# Patient Record
Sex: Male | Born: 1965 | Race: White | Hispanic: No | Marital: Married | Smoking: Never smoker
Health system: Southern US, Community
[De-identification: ages and names within clinical notes are randomized; demographics above are authoritative.]

## PROBLEM LIST (undated history)

## (undated) DIAGNOSIS — I499 Cardiac arrhythmia, unspecified: Secondary | ICD-10-CM

## (undated) DIAGNOSIS — N189 Chronic kidney disease, unspecified: Secondary | ICD-10-CM

## (undated) DIAGNOSIS — G4730 Sleep apnea, unspecified: Secondary | ICD-10-CM

## (undated) DIAGNOSIS — C801 Malignant (primary) neoplasm, unspecified: Secondary | ICD-10-CM

## (undated) DIAGNOSIS — M109 Gout, unspecified: Secondary | ICD-10-CM

## (undated) HISTORY — PX: OTHER SURGICAL HISTORY: SHX169

## (undated) HISTORY — PX: CARDIAC ELECTROPHYSIOLOGY MAPPING AND ABLATION: SHX1292

---

## 2009-05-30 ENCOUNTER — Emergency Department: Payer: Self-pay | Admitting: Emergency Medicine

## 2012-09-11 ENCOUNTER — Ambulatory Visit: Payer: Self-pay | Admitting: Orthopedic Surgery

## 2014-10-28 ENCOUNTER — Encounter: Payer: Self-pay | Admitting: *Deleted

## 2014-10-31 ENCOUNTER — Ambulatory Visit: Payer: Managed Care, Other (non HMO) | Admitting: Anesthesiology

## 2014-10-31 ENCOUNTER — Encounter: Payer: Self-pay | Admitting: Anesthesiology

## 2014-10-31 ENCOUNTER — Ambulatory Visit
Admission: RE | Admit: 2014-10-31 | Discharge: 2014-10-31 | Disposition: A | Discharge status: Home / Self Care | Payer: Managed Care, Other (non HMO) | Source: Ambulatory Visit | Attending: Gastroenterology | Admitting: Gastroenterology

## 2014-10-31 ENCOUNTER — Encounter
Admission: RE | Disposition: A | Discharge status: Home / Self Care | Payer: Self-pay | Source: Ambulatory Visit | Attending: Gastroenterology

## 2014-10-31 DIAGNOSIS — Z1211 Encounter for screening for malignant neoplasm of colon: Secondary | ICD-10-CM | POA: Diagnosis present

## 2014-10-31 DIAGNOSIS — G4730 Sleep apnea, unspecified: Secondary | ICD-10-CM | POA: Insufficient documentation

## 2014-10-31 DIAGNOSIS — Z85820 Personal history of malignant melanoma of skin: Secondary | ICD-10-CM | POA: Diagnosis not present

## 2014-10-31 DIAGNOSIS — N189 Chronic kidney disease, unspecified: Secondary | ICD-10-CM | POA: Diagnosis not present

## 2014-10-31 DIAGNOSIS — Z79899 Other long term (current) drug therapy: Secondary | ICD-10-CM | POA: Insufficient documentation

## 2014-10-31 DIAGNOSIS — D122 Benign neoplasm of ascending colon: Secondary | ICD-10-CM | POA: Insufficient documentation

## 2014-10-31 DIAGNOSIS — K621 Rectal polyp: Secondary | ICD-10-CM | POA: Insufficient documentation

## 2014-10-31 DIAGNOSIS — I499 Cardiac arrhythmia, unspecified: Secondary | ICD-10-CM | POA: Insufficient documentation

## 2014-10-31 DIAGNOSIS — D120 Benign neoplasm of cecum: Secondary | ICD-10-CM | POA: Insufficient documentation

## 2014-10-31 DIAGNOSIS — M109 Gout, unspecified: Secondary | ICD-10-CM | POA: Insufficient documentation

## 2014-10-31 HISTORY — DX: Cardiac arrhythmia, unspecified: I49.9

## 2014-10-31 HISTORY — DX: Sleep apnea, unspecified: G47.30

## 2014-10-31 HISTORY — DX: Gout, unspecified: M10.9

## 2014-10-31 HISTORY — PX: COLONOSCOPY WITH PROPOFOL: SHX5780

## 2014-10-31 HISTORY — DX: Chronic kidney disease, unspecified: N18.9

## 2014-10-31 SURGERY — COLONOSCOPY WITH PROPOFOL
Anesthesia: General

## 2014-10-31 MED ORDER — LIDOCAINE HCL (CARDIAC) 20 MG/ML IV SOLN
INTRAVENOUS | Status: DC | PRN
  Administered 2014-10-31: 100 mg via INTRAVENOUS

## 2014-10-31 MED ORDER — SODIUM CHLORIDE 0.9 % IV SOLN: INTRAVENOUS | Status: DC

## 2014-10-31 MED ORDER — PROPOFOL INFUSION 10 MG/ML OPTIME
INTRAVENOUS | Status: DC | PRN
  Administered 2014-10-31: 150 ug/kg/min via INTRAVENOUS

## 2014-10-31 MED ORDER — SODIUM CHLORIDE 0.9 % IV SOLN
INTRAVENOUS | Status: DC
  Administered 2014-10-31: 1000 mL via INTRAVENOUS

## 2014-10-31 MED ORDER — LIDOCAINE HCL (PF) 1 % IJ SOLN
2.0000 mL | Freq: Once | INTRAMUSCULAR | Status: AC
  Administered 2014-10-31: 0.3 mL via INTRADERMAL

## 2014-10-31 MED ORDER — LIDOCAINE HCL (PF) 1 % IJ SOLN
INTRAMUSCULAR | Status: AC
  Administered 2014-10-31: 0.3 mL via INTRADERMAL
  Filled 2014-10-31: qty 2

## 2014-10-31 MED ORDER — PROPOFOL 10 MG/ML IV BOLUS
INTRAVENOUS | Status: DC | PRN
  Administered 2014-10-31: 90 mg via INTRAVENOUS
  Administered 2014-10-31: 40 mg via INTRAVENOUS
  Administered 2014-10-31 (×2): 30 mg via INTRAVENOUS

## 2014-10-31 NOTE — H&P (Signed)
  Primary Care Physician:  Kirk Ruths., MD  Pre-Procedure History & Physical: HPI:  Evan Allen is a 49 y.o. male is here for an colonoscopy.   Past Medical History  Diagnosis Date  . Dysrhythmia   . Gout   . Sleep apnea   . Chronic kidney disease     Past Surgical History  Procedure Laterality Date  . Melanoma resection    . Cardiac electrophysiology mapping and ablation      Prior to Admission medications   Medication Sig Start Date End Date Taking? Authorizing Melodi Happel  colchicine-probenecid 0.5-500 MG per tablet Take 1 tablet by mouth 2 (two) times daily.   Yes Historical Neftali Thurow, MD    Allergies as of 10/04/2014  . (Not on File)    History reviewed. No pertinent family history.  Social History   Social History  . Marital Status: Married    Spouse Name: N/A  . Number of Children: N/A  . Years of Education: N/A   Occupational History  . Not on file.   Social History Main Topics  . Smoking status: Not on file  . Smokeless tobacco: Not on file  . Alcohol Use: Not on file  . Drug Use: Not on file  . Sexual Activity: Not on file   Other Topics Concern  . Not on file   Social History Narrative     Physical Exam: BP 129/78 mmHg  Temp(Src) 97.9 F (36.6 C) (Tympanic)  Resp 17  Ht 6' (1.829 m)  Wt 88.451 kg (195 lb)  BMI 26.44 kg/m2  SpO2 98% General:   Alert,  pleasant and cooperative in NAD Head:  Normocephalic and atraumatic. Neck:  Supple; no masses or thyromegaly. Lungs:  Clear throughout to auscultation.    Heart:  Regular rate and rhythm. Abdomen:  Soft, nontender and nondistended. Normal bowel sounds, without guarding, and without rebound.   Neurologic:  Alert and  oriented x4;  grossly normal neurologically.  Impression/Plan: Evan Allen is here for an colonoscopy to be performed for screening avg risk  Risks, benefits, limitations, and alternatives regarding  colonoscopy have been reviewed with the patient.  Questions  have been answered.  All parties agreeable.   Josefine Class, MD  10/31/2014, 9:24 AM

## 2014-10-31 NOTE — Anesthesia Postprocedure Evaluation (Signed)
  Anesthesia Post-op Note  Patient: Evan Allen  Procedure(s) Performed: Procedure(s): COLONOSCOPY WITH PROPOFOL (N/A)  Anesthesia type:General  Patient location: PACU  Post pain: Pain level controlled  Post assessment: Post-op Vital signs reviewed, Patient's Cardiovascular Status Stable, Respiratory Function Stable, Patent Airway and No signs of Nausea or vomiting  Post vital signs: Reviewed and stable  Last Vitals:  Filed Vitals:   10/31/14 1030  BP: 111/68  Pulse: 68  Temp:   Resp: 10    Level of consciousness: awake, alert  and patient cooperative  Complications: No apparent anesthesia complications

## 2014-10-31 NOTE — Transfer of Care (Signed)
Immediate Anesthesia Transfer of Care Note  Patient: Evan Allen  Procedure(s) Performed: Procedure(s): COLONOSCOPY WITH PROPOFOL (N/A)  Patient Location: Endoscopy Unit  Anesthesia Type:General  Level of Consciousness: awake  Airway & Oxygen Therapy: Patient Spontanous Breathing and Patient connected to nasal cannula oxygen  Post-op Assessment: Report given to RN and Post -op Vital signs reviewed and stable  Post vital signs: Reviewed and stable  Last Vitals:  Filed Vitals:   10/31/14 1003  BP: 106/76  Pulse: 67  Temp:   Resp: 15    Complications: No apparent anesthesia complications

## 2014-10-31 NOTE — Op Note (Signed)
[facility] Patient Name: Evan Allen Procedure Date: 10/31/2014 9:25 AM MRN: [id number] Account #: [id number] Date of Birth: 1965-02-24 Admit Type: Outpatient Age: 49 Room: [facility] ENDO ROOM 3 Gender: Male Note Status: Finalized Procedure:         Colonoscopy Indications:       Screening for colorectal malignant neoplasm, This is the                     patient's first colonoscopy Patient Profile:   This is a 49 year old male. Providers:         Gerrit Heck. Rayann Heman, MD Referring MD:      Ocie Cornfield. Ouida Sills, MD (Referring MD) Medicines:         Propofol per Anesthesia Complications:     No immediate complications. Procedure:         Pre-Anesthesia Assessment:                    - Prior to the procedure, a History and Physical was                     performed, and patient medications, allergies and                     sensitivities were reviewed. The patient's tolerance of                     previous anesthesia was reviewed.                    - Prior to the procedure, a History and Physical was                     performed, and patient medications, allergies and                     sensitivities were reviewed. The patient's tolerance of                     previous anesthesia was reviewed.                    After obtaining informed consent, the colonoscope was                     passed under direct vision. Throughout the procedure, the                     patient's blood pressure, pulse, and oxygen saturations                     were monitored continuously. The Colonoscope was                     introduced through the anus and advanced to the the cecum,                     identified by appendiceal orifice and ileocecal valve. The                     colonoscopy was performed without difficulty. The patient                     tolerated the procedure well. The quality of the bowel  preparation was excellent. Findings:    The perianal and digital rectal examinations were normal.      Two sessile polyps were found in the cecum. The polyps were 2 to 3 mm in       size. These polyps were removed with a jumbo cold forceps. Resection and       retrieval were complete.      Two sessile polyps were found in the ascending colon. The polyps were 2       to 3 mm in size. These polyps were removed with a jumbo cold forceps.       Resection and retrieval were complete.      A 3 mm polyp was found in the rectum. The polyp was sessile. The polyp       was removed with a jumbo cold forceps. Resection and retrieval were       complete.      The exam was otherwise without abnormality on direct and retroflexion       views. Impression:        - Two 2 to 3 mm polyps in the cecum. Resected and                     retrieved.                    - Two 2 to 3 mm polyps in the ascending colon. Resected                     and retrieved.                    - One 3 mm polyp in the rectum. Resected and retrieved.                    - The examination was otherwise normal on direct and                     retroflexion views. Recommendation:    - Observe patient in GI recovery unit.                    - Resume regular diet.                    - Continue present medications.                    - Await pathology results.                    - Repeat colonoscopy for surveillance based on pathology                     results.                    - The findings and recommendations were discussed with the                     patient.                    - The findings and recommendations were discussed with the                     patient's family. Procedure Code(s): --- Professional ---  02111, Colonoscopy, flexible; with biopsy, single or                     multiple CPT copyright 2014 American Medical Association. All rights reserved. The codes documented in this report are preliminary and upon coder review may  be  revised to meet current compliance requirements. Mellody Life, MD 10/31/2014 10:04:30 AM This report has been signed electronically. Number of Addenda: 0 Note Initiated On: 10/31/2014 9:25 AM Scope Withdrawal Time: 0 hours 17 minutes 57 seconds  Total Procedure Duration: 0 hours 24 minutes 20 seconds       [facility]

## 2014-10-31 NOTE — Anesthesia Preprocedure Evaluation (Signed)
Anesthesia Evaluation  Patient identified by MRN, date of birth, ID band Patient awake    Reviewed: Allergy & Precautions, H&P , NPO status , Patient's Chart, lab work & pertinent test results  Airway Mallampati: II  TM Distance: >3 FB Neck ROM: full    Dental no notable dental hx. (+) Teeth Intact   Pulmonary sleep apnea ,    Pulmonary exam normal breath sounds clear to auscultation       Cardiovascular Exercise Tolerance: Good (-) Past MI Normal cardiovascular exam+ dysrhythmias Atrial Fibrillation  Rhythm:regular Rate:Normal     Neuro/Psych negative neurological ROS  negative psych ROS   GI/Hepatic negative GI ROS, Neg liver ROS, neg GERD  ,  Endo/Other  negative endocrine ROS  Renal/GU Renal disease  negative genitourinary   Musculoskeletal   Abdominal   Peds  Hematology negative hematology ROS (+)   Anesthesia Other Findings Past Medical History:   Dysrhythmia                                                  Gout                                                         Sleep apnea                                                  Chronic kidney disease                                       Reproductive/Obstetrics negative OB ROS                             Anesthesia Physical Anesthesia Plan  ASA: III  Anesthesia Plan: General   Post-op Pain Management:    Induction:   Airway Management Planned:   Additional Equipment:   Intra-op Plan:   Post-operative Plan:   Informed Consent: I have reviewed the patients History and Physical, chart, labs and discussed the procedure including the risks, benefits and alternatives for the proposed anesthesia with the patient or authorized representative who has indicated his/her understanding and acceptance.   Dental Advisory Given  Plan Discussed with: Anesthesiologist, CRNA and Surgeon  Anesthesia Plan Comments:          Anesthesia Quick Evaluation

## 2014-11-01 LAB — SURGICAL PATHOLOGY

## 2014-11-02 ENCOUNTER — Encounter: Payer: Self-pay | Admitting: Gastroenterology

## 2016-08-06 ENCOUNTER — Ambulatory Visit (INDEPENDENT_AMBULATORY_CARE_PROVIDER_SITE_OTHER): Payer: 59 | Admitting: Psychology

## 2016-08-06 DIAGNOSIS — F341 Dysthymic disorder: Secondary | ICD-10-CM

## 2016-08-22 ENCOUNTER — Ambulatory Visit (INDEPENDENT_AMBULATORY_CARE_PROVIDER_SITE_OTHER): Payer: 59 | Admitting: Psychology

## 2016-08-22 DIAGNOSIS — F341 Dysthymic disorder: Secondary | ICD-10-CM

## 2016-09-05 ENCOUNTER — Ambulatory Visit: Payer: 59 | Admitting: Psychology

## 2016-09-09 ENCOUNTER — Encounter: Payer: Self-pay | Admitting: Emergency Medicine

## 2016-09-09 ENCOUNTER — Emergency Department
Admission: EM | Admit: 2016-09-09 | Discharge: 2016-09-09 | Disposition: A | Discharge status: Home / Self Care | Payer: 59 | Attending: Emergency Medicine | Admitting: Emergency Medicine

## 2016-09-09 DIAGNOSIS — N189 Chronic kidney disease, unspecified: Secondary | ICD-10-CM | POA: Insufficient documentation

## 2016-09-09 DIAGNOSIS — Z79899 Other long term (current) drug therapy: Secondary | ICD-10-CM | POA: Diagnosis not present

## 2016-09-09 DIAGNOSIS — W2104XA Struck by golf ball, initial encounter: Secondary | ICD-10-CM | POA: Diagnosis not present

## 2016-09-09 DIAGNOSIS — Y998 Other external cause status: Secondary | ICD-10-CM | POA: Diagnosis not present

## 2016-09-09 DIAGNOSIS — S01112A Laceration without foreign body of left eyelid and periocular area, initial encounter: Secondary | ICD-10-CM | POA: Diagnosis present

## 2016-09-09 DIAGNOSIS — Y9239 Other specified sports and athletic area as the place of occurrence of the external cause: Secondary | ICD-10-CM | POA: Insufficient documentation

## 2016-09-09 DIAGNOSIS — Y9353 Activity, golf: Secondary | ICD-10-CM | POA: Diagnosis not present

## 2016-09-09 MED ORDER — CEPHALEXIN 750 MG PO CAPS: 750.0000 mg | ORAL_CAPSULE | Freq: Two times a day (BID) | ORAL | 0 refills | Status: AC

## 2016-09-09 MED ORDER — LIDOCAINE HCL (PF) 1 % IJ SOLN
10.0000 mL | Freq: Once | INTRAMUSCULAR | Status: DC
  Filled 2016-09-09: qty 10

## 2016-09-09 NOTE — ED Provider Notes (Signed)
[facility] Emergency Department Provider Note  ____________________________________________  Time seen: Approximately 9:22 PM  I have reviewed the triage vital signs and the nursing notes.   HISTORY  Chief Complaint Head Laceration    HPI Evan Allen is a 51 y.o. male who presents emergency department complaining of a laceration to his left eyebrow. Patient reports that he was playing golf when he struck a shot which pounds off a tree and caught him in the left eyebrow. Patient denies any loss of consciousness. He denies any blurred vision or eye pain. Only injury is laceration left eyebrow. Tetanus shot is less than 110 years old. No medications prior to arrival. No other injury or complaint at this time.   Past Medical History:  Diagnosis Date  . Chronic kidney disease   . Dysrhythmia   . Gout   . Sleep apnea     There are no active problems to display for this patient.   Past Surgical History:  Procedure Laterality Date  . CARDIAC ELECTROPHYSIOLOGY MAPPING AND ABLATION    . COLONOSCOPY WITH PROPOFOL N/A 10/31/2014   Procedure: COLONOSCOPY WITH PROPOFOL;  Surgeon: Josefine Class, MD;  Location: [facility] ENDOSCOPY;  Service: Endoscopy;  Laterality: N/A;  . melanoma resection      Prior to Admission medications   Medication Sig Start Date End Date Taking? Authorizing Provider  cephALEXin (KEFLEX) 750 MG capsule Take 1 capsule (750 mg total) by mouth 2 (two) times daily. 09/09/16   Cuthriell, Charline Bills, PA-C  colchicine-probenecid 0.5-500 MG per tablet Take 1 tablet by mouth 2 (two) times daily.    [provider]    Allergies Patient has no known allergies.  No family history on file.  Social History Social History  Substance Use Topics  . Smoking status: Never Smoker  . Smokeless tobacco: Never Used  . Alcohol use Not on file     Review of Systems  Constitutional: No fever/chills Eyes: No visual changes. No  discharge ENT: No upper respiratory complaints. Cardiovascular: no chest pain. Respiratory: no cough. No SOB. Gastrointestinal: No abdominal pain.  No nausea, no vomiting.   Musculoskeletal: Negative for musculoskeletal pain. Skin: Positive for laceration to the left eyebrow Neurological: Negative for headaches, focal weakness or numbness. 10-point ROS otherwise negative.  ____________________________________________   PHYSICAL EXAM:  VITAL SIGNS: ED Triage Vitals  Enc Vitals Group     BP 09/09/16 2115 132/81     Pulse --      Resp 09/09/16 2115 16     Temp 09/09/16 2115 98 F (36.7 C)     Temp Source 09/09/16 2115 Oral     SpO2 09/09/16 2115 96 %     Weight 09/09/16 2116 198 lb (89.8 kg)     Height 09/09/16 2116 6\' 1"  (1.854 m)     Head Circumference --      Peak Flow --      Pain Score 09/09/16 2115 2     Pain Loc --      Pain Edu? --      Excl. in [place]? --      Constitutional: Alert and oriented. Well appearing and in no acute distress. Eyes: Conjunctivae are normal. PERRL. EOMI. Head: 2.5 cm laceration noted to left eyebrow. Edges are smooth nature. Edges are gaped open. No bleeding. No foreign body. No surrounding ecchymosis. Patient is nontender to palpation of the osseous structures of the skull and face. No battle signs. No raccoon eyes. No serosanguineous  fluid drainage from ears or nares. ENT:      Ears:       Nose: No congestion/rhinnorhea.      Mouth/Throat: Mucous membranes are moist.  Neck: No stridor.  No cervical spine tenderness to palpation  Cardiovascular: Normal rate, regular rhythm. Normal S1 and S2.  Good peripheral circulation. Respiratory: Normal respiratory effort without tachypnea or retractions. Lungs CTAB. Good air entry to the bases with no decreased or absent breath sounds. Musculoskeletal: Full range of motion to all extremities. No gross deformities appreciated. Neurologic:  Normal speech and language. No gross focal neurologic deficits are  appreciated.  Skin:  Skin is warm, dry and intact. No rash noted. See note above for laceration. Psychiatric: Mood and affect are normal. Speech and behavior are normal. Patient exhibits appropriate insight and judgement.   ____________________________________________   LABS (all labs ordered are listed, but only abnormal results are displayed)  Labs Reviewed - No data to display ____________________________________________  EKG   ____________________________________________  RADIOLOGY   No results found.  ____________________________________________    PROCEDURES  Procedure(s) performed:    Marland KitchenMarland KitchenLaceration Repair Date/Time: 09/09/2016 10:24 PM Performed by: Betha Loa D Authorized by: Betha Loa D   Consent:    Consent obtained:  Verbal   Consent given by:  Patient   Risks discussed:  Pain and poor cosmetic result Anesthesia (see MAR for exact dosages):    Anesthesia method:  Local infiltration   Local anesthetic:  Lidocaine 1% w/o epi Laceration details:    Location:  Face   Face location:  L eyebrow   Length (cm):  2.5 Repair type:    Repair type:  Intermediate Pre-procedure details:    Preparation:  Patient was prepped and draped in usual sterile fashion Exploration:    Hemostasis achieved with:  Direct pressure   Wound exploration: wound explored through full range of motion and entire depth of wound probed and visualized     Wound extent: no foreign bodies/material noted, no muscle damage noted, no nerve damage noted, no tendon damage noted and no underlying fracture noted     Contaminated: yes   Treatment:    Area cleansed with:  Betadine   Amount of cleaning:  Extensive   Irrigation solution:  Sterile saline   Irrigation method:  Syringe Subcutaneous repair:    Suture size:  6-0   Suture material:  Vicryl   Suture technique:  Simple interrupted   Number of sutures:  4 Skin repair:    Repair method:  Sutures   Suture size:   5-0   Suture material:  Prolene   Suture technique:  Simple interrupted   Number of sutures:  7 Approximation:    Approximation:  Close Post-procedure details:    Dressing:  Open (no dressing)   Patient tolerance of procedure:  Tolerated well, no immediate complications      Medications  lidocaine (PF) (XYLOCAINE) 1 % injection 10 mL (not administered)     ____________________________________________   INITIAL IMPRESSION / ASSESSMENT AND PLAN / ED COURSE  Pertinent labs & imaging results that were available during my care of the patient were reviewed by me and considered in my medical decision making (see chart for details).  Review of the [place] CSRS was performed in accordance of the [place] prior to dispensing any controlled drugs.     Patient's diagnosis is consistent with eyebrow laceration. This is close described above. Patient tolerated well. Wound care instructions are given to patient. He will follow  up with primary care in 1 week for suture removal. Due to the nature of injury, patient will be placed on antibiotics prophylactically. Tetanus shot was up-to-date at this time..  Patient is given ED precautions to return to the ED for any worsening or new symptoms.     ____________________________________________  FINAL CLINICAL IMPRESSION(S) / ED DIAGNOSES  Final diagnoses:  Laceration of left eyebrow, initial encounter      NEW MEDICATIONS STARTED DURING THIS VISIT:  New Prescriptions   CEPHALEXIN (KEFLEX) 750 MG CAPSULE    Take 1 capsule (750 mg total) by mouth 2 (two) times daily.        This chart was dictated using voice recognition software/Dragon. Despite best efforts to proofread, errors can occur which can change the meaning. Any change was purely unintentional.    Darletta Moll, PA-C 09/09/16 2226    Lisa Roca, MD 09/09/16 [phone]

## 2016-09-09 NOTE — ED Triage Notes (Signed)
Pt arrived via pov after golfing accident. Pt reports trying to hit the ball around a tree instead the ball hit the tree and came back hitting him at the left inner portion of his eyebrow. Laceration's bleeding in contained.

## 2016-09-16 ENCOUNTER — Ambulatory Visit (INDEPENDENT_AMBULATORY_CARE_PROVIDER_SITE_OTHER): Payer: 59 | Admitting: Psychology

## 2016-09-16 DIAGNOSIS — F341 Dysthymic disorder: Secondary | ICD-10-CM | POA: Diagnosis not present

## 2016-10-28 ENCOUNTER — Ambulatory Visit: Payer: 59 | Admitting: Psychology

## 2016-11-11 ENCOUNTER — Ambulatory Visit: Payer: 59 | Admitting: Psychology

## 2016-12-05 ENCOUNTER — Ambulatory Visit: Payer: 59 | Admitting: Psychology

## 2016-12-18 ENCOUNTER — Ambulatory Visit: Payer: 59 | Admitting: Psychology

## 2019-01-12 ENCOUNTER — Other Ambulatory Visit: Payer: Self-pay | Admitting: Internal Medicine

## 2019-01-12 DIAGNOSIS — R0602 Shortness of breath: Secondary | ICD-10-CM

## 2019-01-12 DIAGNOSIS — R0789 Other chest pain: Secondary | ICD-10-CM

## 2019-01-13 ENCOUNTER — Ambulatory Visit
Admission: RE | Admit: 2019-01-13 | Discharge: 2019-01-13 | Disposition: A | Discharge status: Home / Self Care | Payer: 59 | Source: Ambulatory Visit | Attending: Internal Medicine | Admitting: Internal Medicine

## 2019-01-13 ENCOUNTER — Other Ambulatory Visit: Payer: Self-pay

## 2019-01-13 DIAGNOSIS — R0789 Other chest pain: Secondary | ICD-10-CM

## 2019-01-13 DIAGNOSIS — R0602 Shortness of breath: Secondary | ICD-10-CM

## 2019-01-13 HISTORY — DX: Malignant (primary) neoplasm, unspecified: C80.1

## 2019-01-13 MED ORDER — IOHEXOL 350 MG/ML SOLN
75.0000 mL | Freq: Once | INTRAVENOUS | Status: AC | PRN
  Administered 2019-01-13: 75 mL via INTRAVENOUS

## 2020-10-05 IMAGING — CT CT ANGIO CHEST
2 of 6 series · 18 of 46 positions shown · IV contrast (omnipaque)
Comparison: None.

CLINICAL DATA: Left-sided chest wall pain. Previous history of
cardiac ablation. Shortness of breath with exertion.

EXAM:
CT ANGIOGRAPHY CHEST WITH CONTRAST
TECHNIQUE: Multidetector CT imaging of the chest was performed using the
standard protocol during bolus administration of intravenous
contrast. Multiplanar CT image reconstructions and MIPs were
obtained to evaluate the vascular anatomy.
CONTRAST:  75mL OMNIPAQUE IOHEXOL 350 MG/ML SOLN

[Series 5: thins · axial · 0.80mm/px · z∈[-402,-120]mm · 15 of 310 slices shown]
[im 14/310  lung]
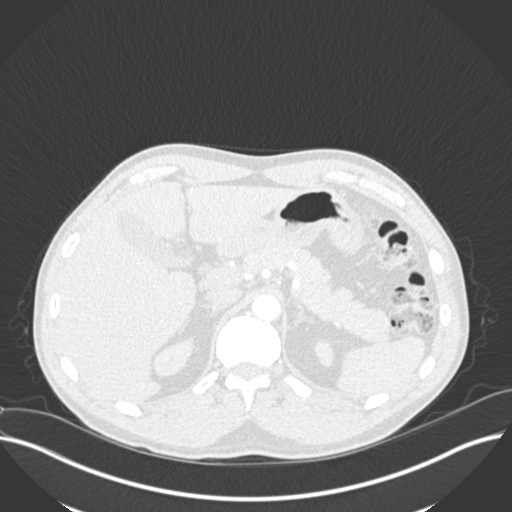
[im 41/310  soft-tissue]
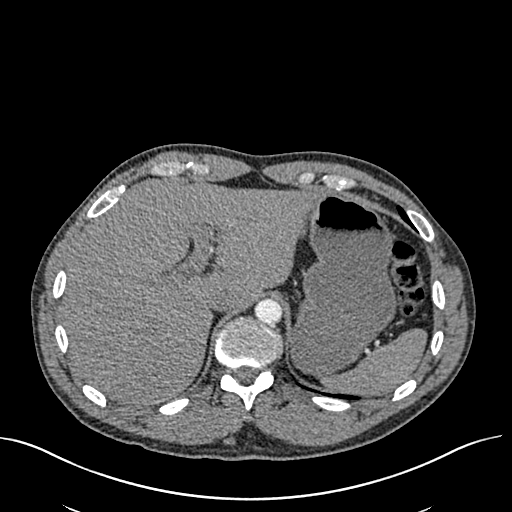
[im 54/310  lung]
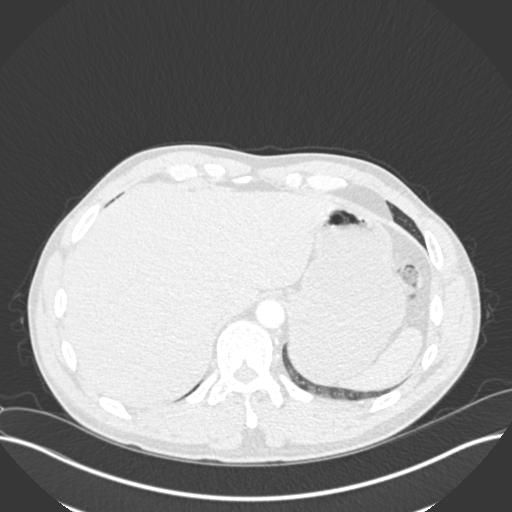
[im 81/310  soft-tissue]
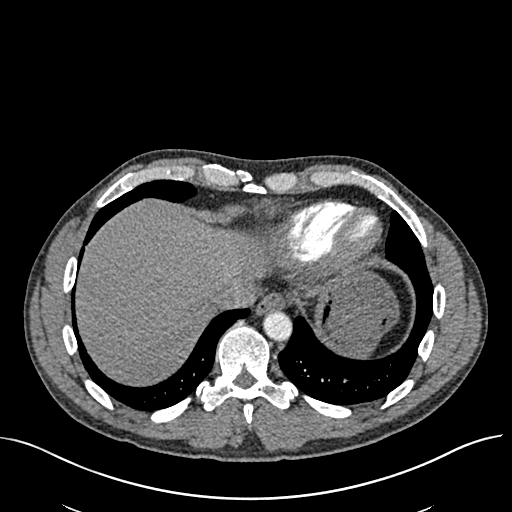
[im 95/310  lung]
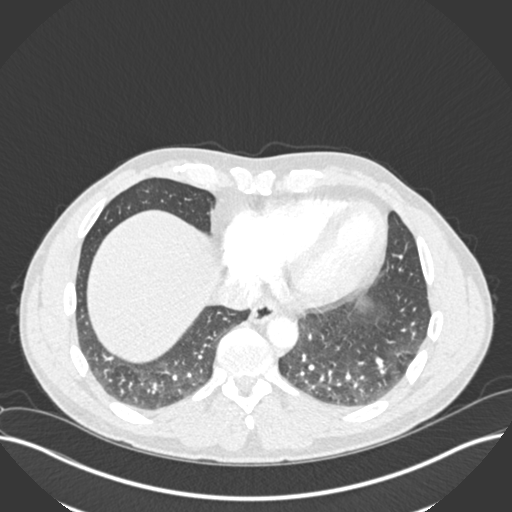
[im 121/310  soft-tissue]
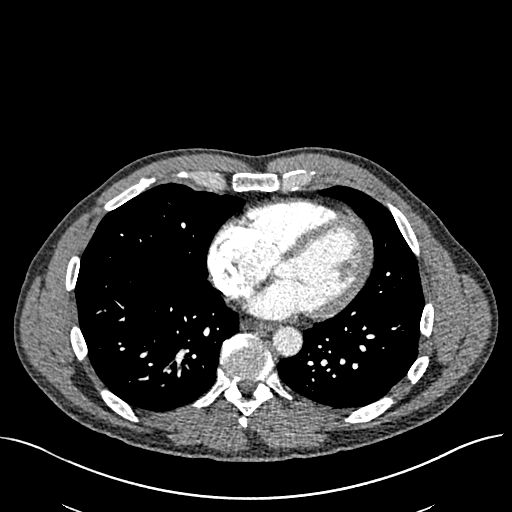
[im 135/310  lung]
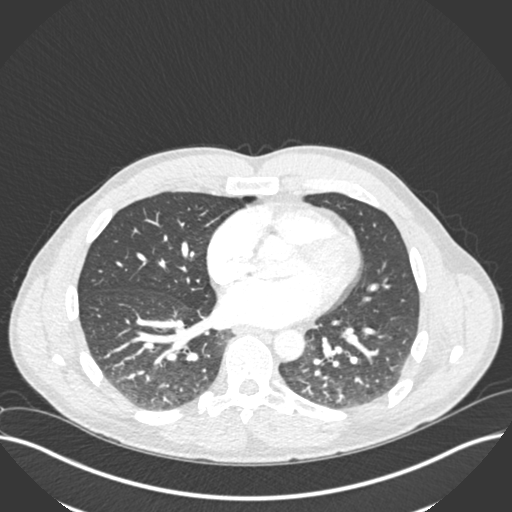
[im 162/310  soft-tissue]
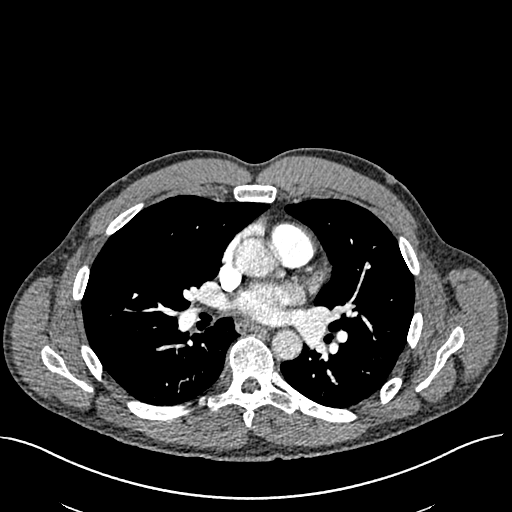
[im 175/310  lung]
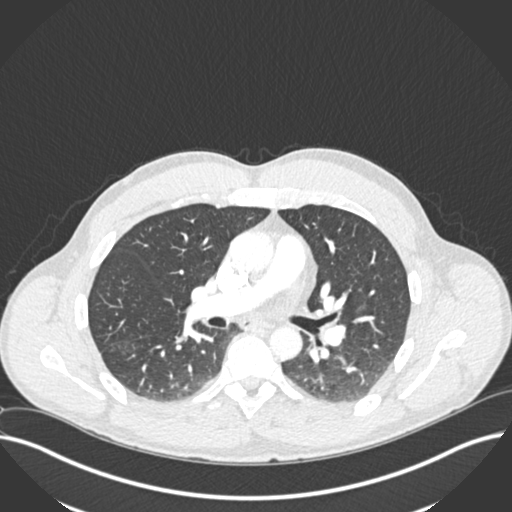
[im 189/310  soft-tissue]
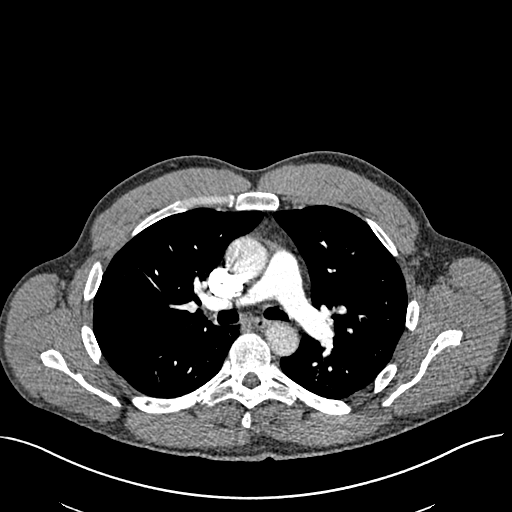
[im 215/310  lung]
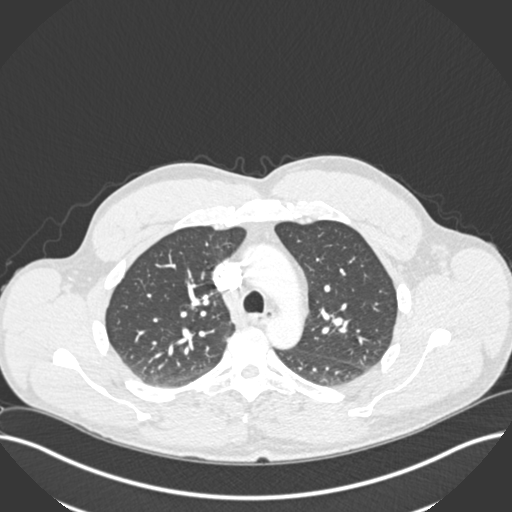
[im 229/310  soft-tissue]
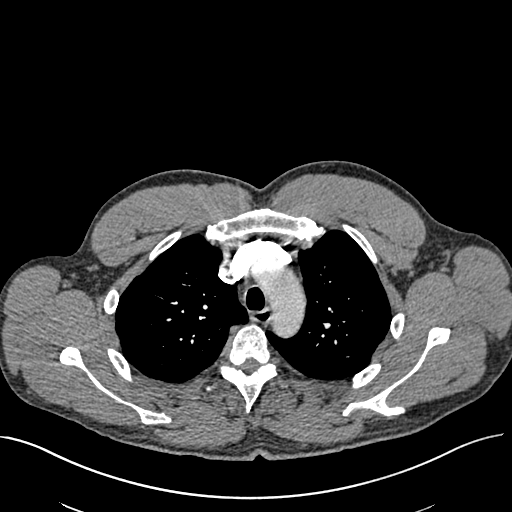
[im 256/310  lung]
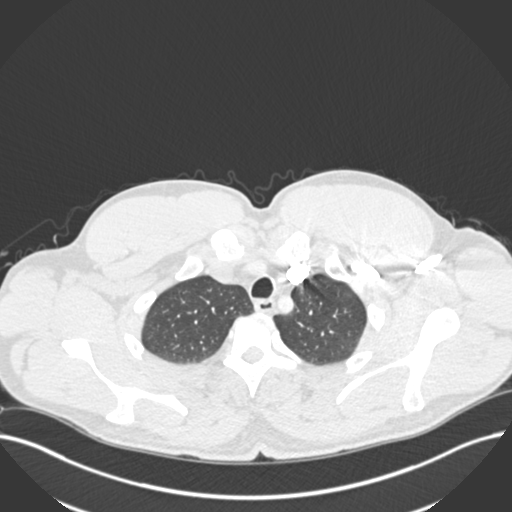
[im 269/310  soft-tissue]
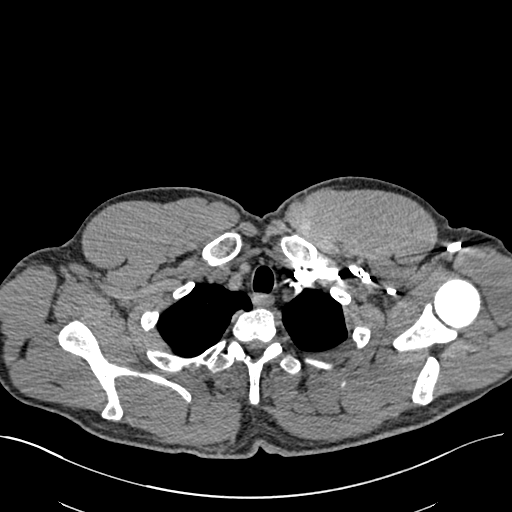
[im 296/310  lung]
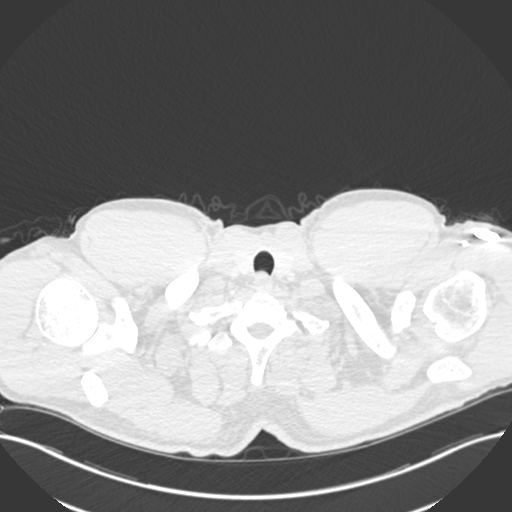

[Series 7: coronal mpr · coronal · 0.61mm/px · 3 of 90 slices shown]
[im 23/90  soft-tissue]
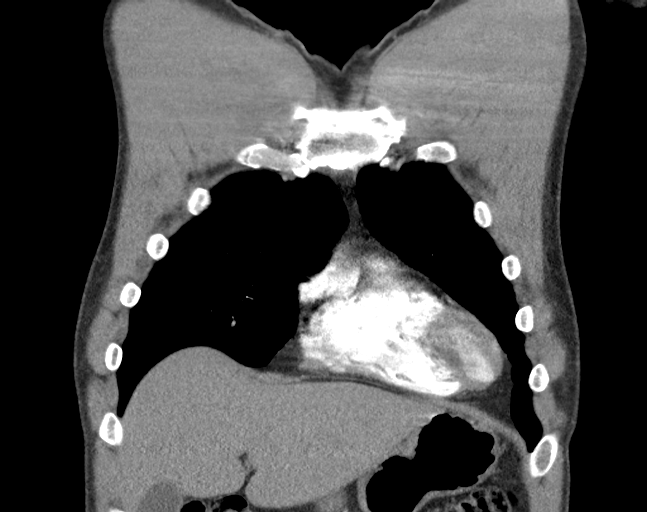
[im 45/90  soft-tissue]
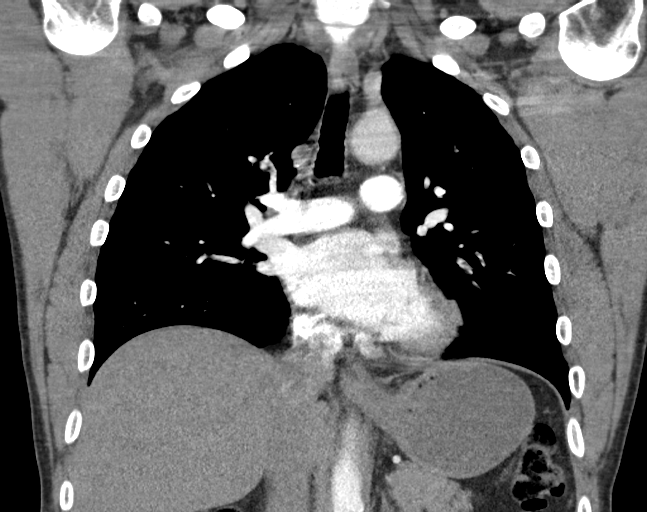
[im 67/90  soft-tissue]
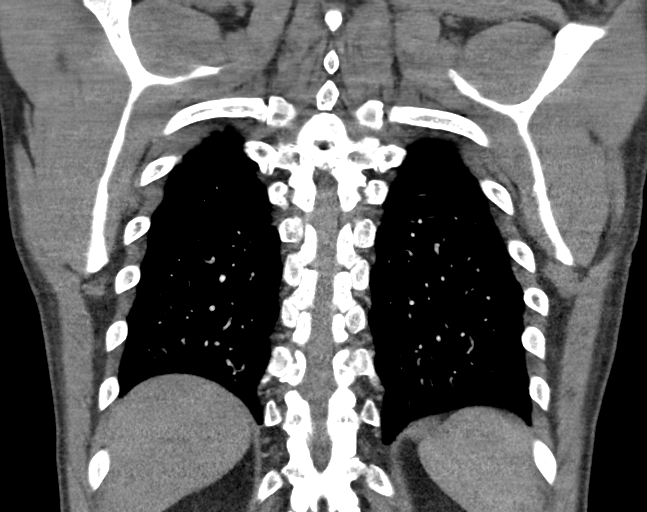

[18 of 46 positions shown; findings below may reference images not displayed]

FINDINGS: Cardiovascular: The heart size is normal. Aorta and great vessel
origins are within normal limits. Pulmonary artery opacification is
excellent. Focal filling defects are present to suggest pulmonary
embolus.

Mediastinum/Nodes: No significant mediastinal or hilar adenopathy is
present. Esophagus is mildly dilated. No mass lesion is present.
Thoracic inlet is within normal limits.

Lungs/Pleura: Mild dependent atelectasis is present. The lungs are
otherwise clear. No focal nodule, mass, or airspace disease is
present. No focal pleural disease is present. There is no
pneumothorax.

Upper Abdomen: Unremarkable.

Musculoskeletal: Ribs are within normal limits. Vertebral body
heights and alignment are maintained. No focal lytic or blastic
lesions are present.

Review of the MIP images confirms the above findings.
IMPRESSION: 1. No pulmonary embolus.
2. No acute or focal lesion to explain the patient's symptoms. No
significant pleural disease or trauma.

## 2022-04-05 ENCOUNTER — Ambulatory Visit (INDEPENDENT_AMBULATORY_CARE_PROVIDER_SITE_OTHER): Payer: 59

## 2022-04-05 DIAGNOSIS — Z86010 Personal history of colonic polyps: Secondary | ICD-10-CM | POA: Diagnosis present

## 2022-04-05 DIAGNOSIS — K640 First degree hemorrhoids: Secondary | ICD-10-CM | POA: Diagnosis not present

## 2022-08-22 ENCOUNTER — Other Ambulatory Visit: Payer: Self-pay | Admitting: Orthopedic Surgery

## 2022-08-22 DIAGNOSIS — M25562 Pain in left knee: Secondary | ICD-10-CM

## 2022-08-30 ENCOUNTER — Ambulatory Visit
Admission: RE | Admit: 2022-08-30 | Discharge: 2022-08-30 | Disposition: A | Discharge status: Home / Self Care | Payer: 59 | Source: Ambulatory Visit | Attending: Orthopedic Surgery | Admitting: Orthopedic Surgery

## 2022-08-30 DIAGNOSIS — M25562 Pain in left knee: Secondary | ICD-10-CM | POA: Diagnosis present

## 2023-12-25 ENCOUNTER — Other Ambulatory Visit
Admission: RE | Admit: 2023-12-25 | Discharge: 2023-12-25 | Disposition: A | Source: Ambulatory Visit | Attending: Internal Medicine | Admitting: Internal Medicine

## 2023-12-25 ENCOUNTER — Ambulatory Visit
Admission: RE | Admit: 2023-12-25 | Discharge: 2023-12-25 | Disposition: A | Discharge status: Home / Self Care | Source: Ambulatory Visit | Attending: Internal Medicine | Admitting: Internal Medicine

## 2023-12-25 ENCOUNTER — Other Ambulatory Visit: Payer: Self-pay | Admitting: Internal Medicine

## 2023-12-25 DIAGNOSIS — R0602 Shortness of breath: Secondary | ICD-10-CM | POA: Insufficient documentation

## 2023-12-25 LAB — D-DIMER, QUANTITATIVE: D-Dimer, Quant: 0.55 ug{FEU}/mL — ABNORMAL HIGH (ref 0.00–0.50)

## 2023-12-25 MED ORDER — IOHEXOL 350 MG/ML SOLN
75.0000 mL | Freq: Once | INTRAVENOUS | Status: AC | PRN
  Administered 2023-12-25: 75 mL via INTRAVENOUS
